# Patient Record
Sex: Male | Born: 1975 | Race: Black or African American | Hispanic: No | Marital: Single | Smoking: Current every day smoker
Health system: Southern US, Community
[De-identification: ages and names within clinical notes are randomized; demographics above are authoritative.]

## PROBLEM LIST (undated history)

## (undated) ENCOUNTER — Emergency Department: Payer: Self-pay

## (undated) DIAGNOSIS — I10 Essential (primary) hypertension: Secondary | ICD-10-CM

---

## 2010-04-25 ENCOUNTER — Emergency Department (HOSPITAL_BASED_OUTPATIENT_CLINIC_OR_DEPARTMENT_OTHER)
Admission: EM | Admit: 2010-04-25 | Discharge: 2010-04-25 | Payer: Self-pay | Source: Home / Self Care | Admitting: Emergency Medicine

## 2012-01-11 IMAGING — CR DG KNEE COMPLETE 4+V*R*
4 series · 4 of 4 positions shown · non-contrast
Comparison: None.

CLINICAL DATA: Tripped and fell.  Twisted knee.

RIGHT KNEE - COMPLETE 4+ VIEW

[t knee ap right]
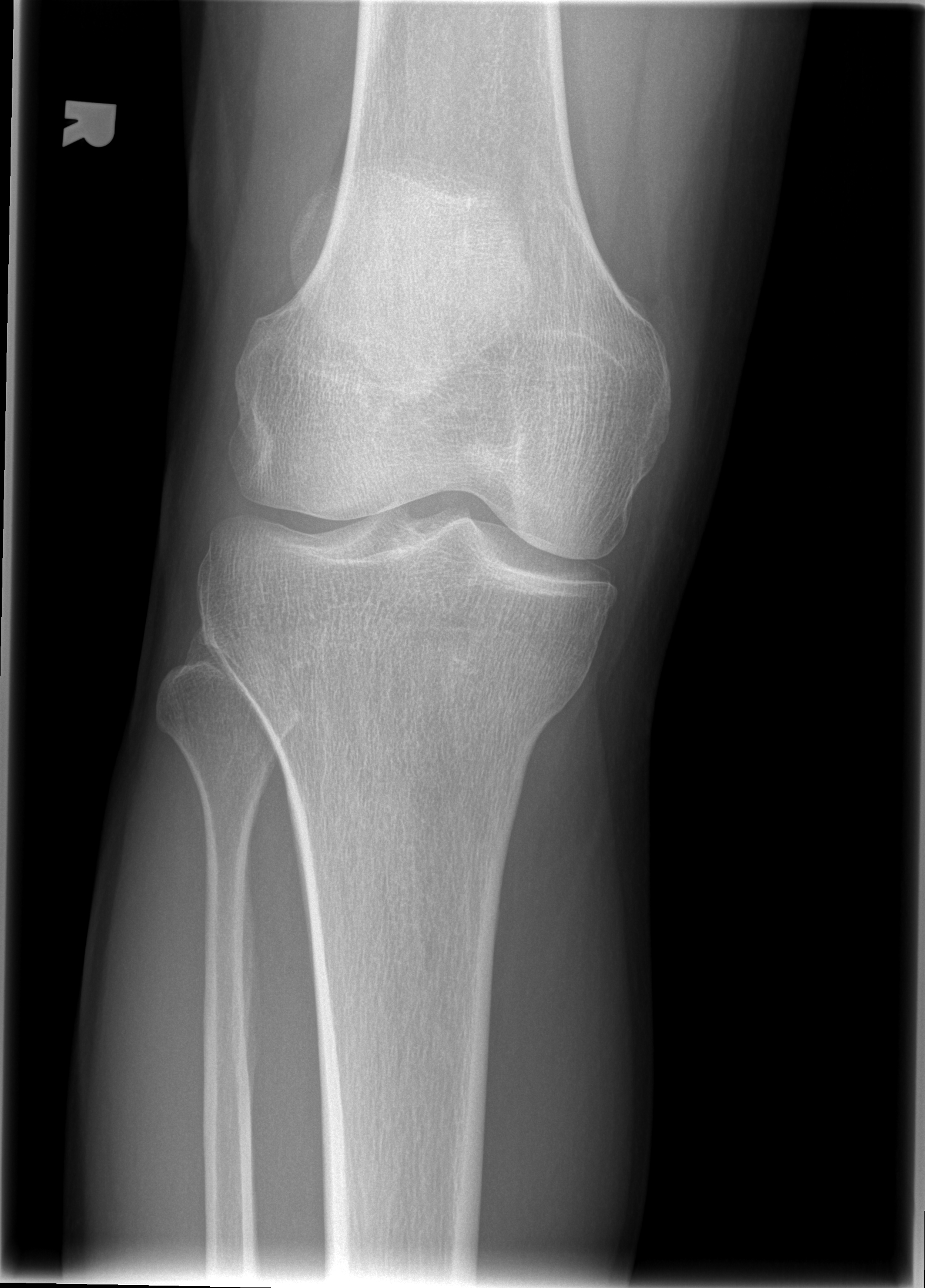

[t knee oblique right (1 of 2)]
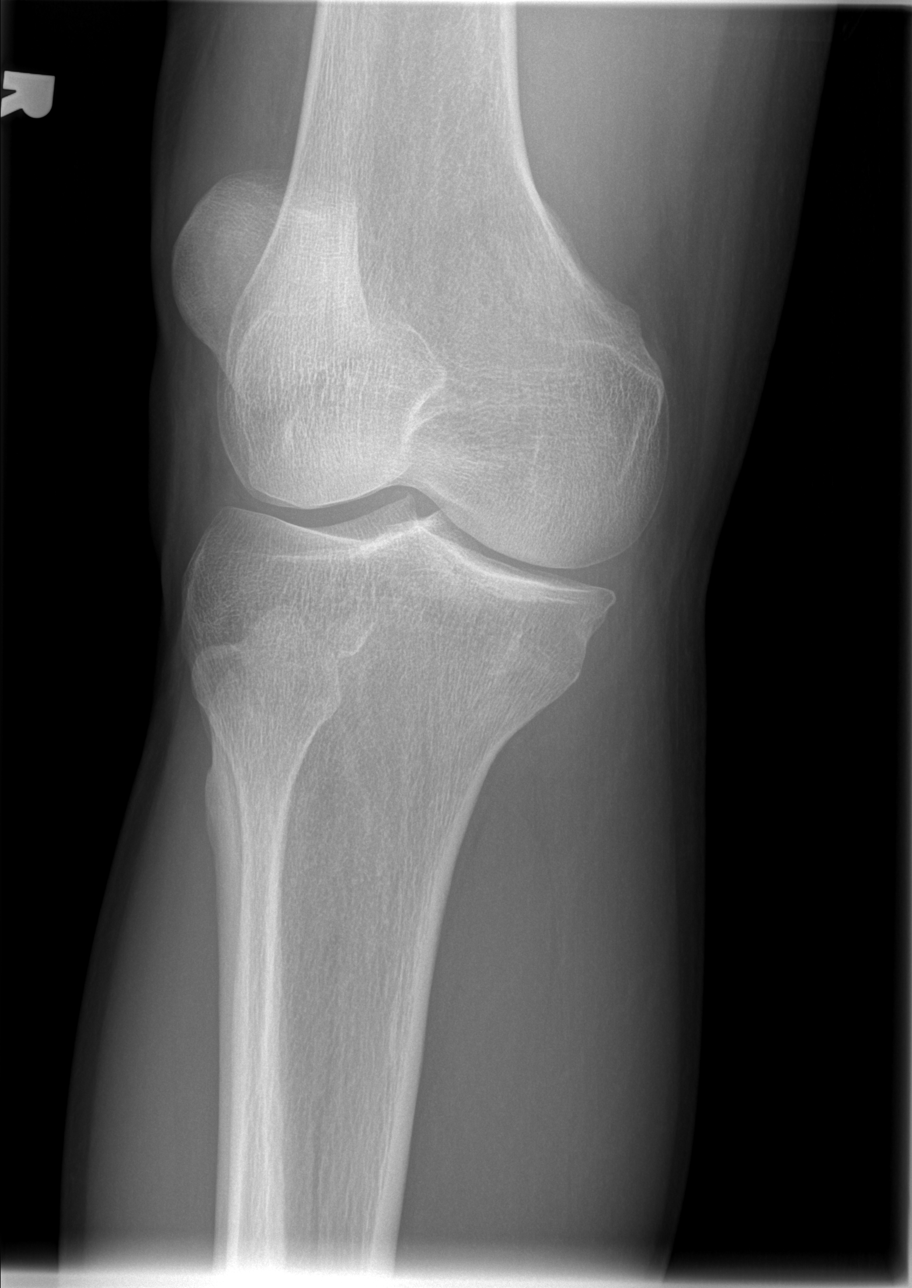

[t knee oblique right (2 of 2)]
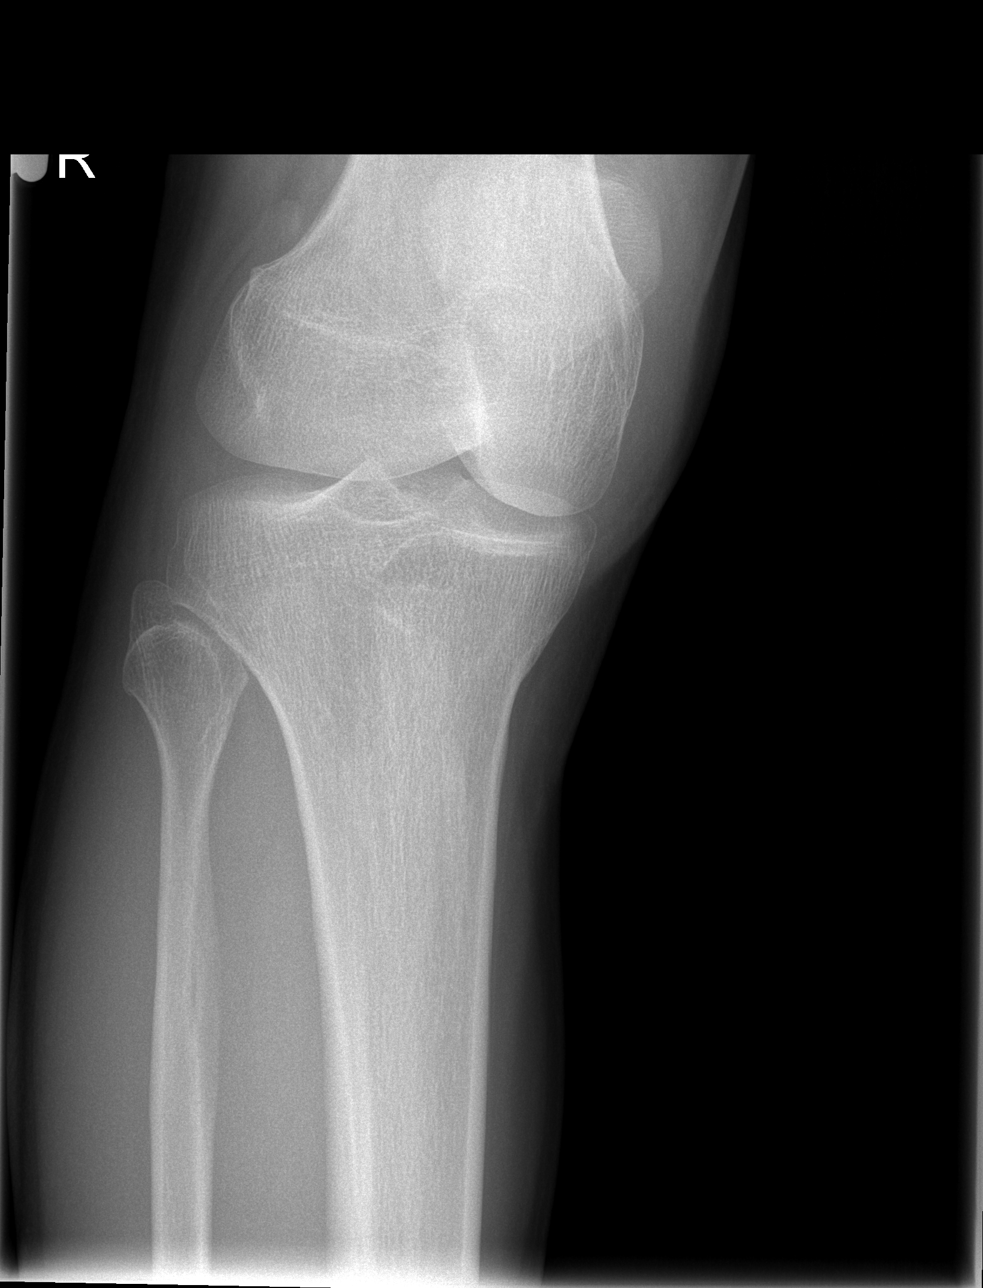

[t knee lat right]
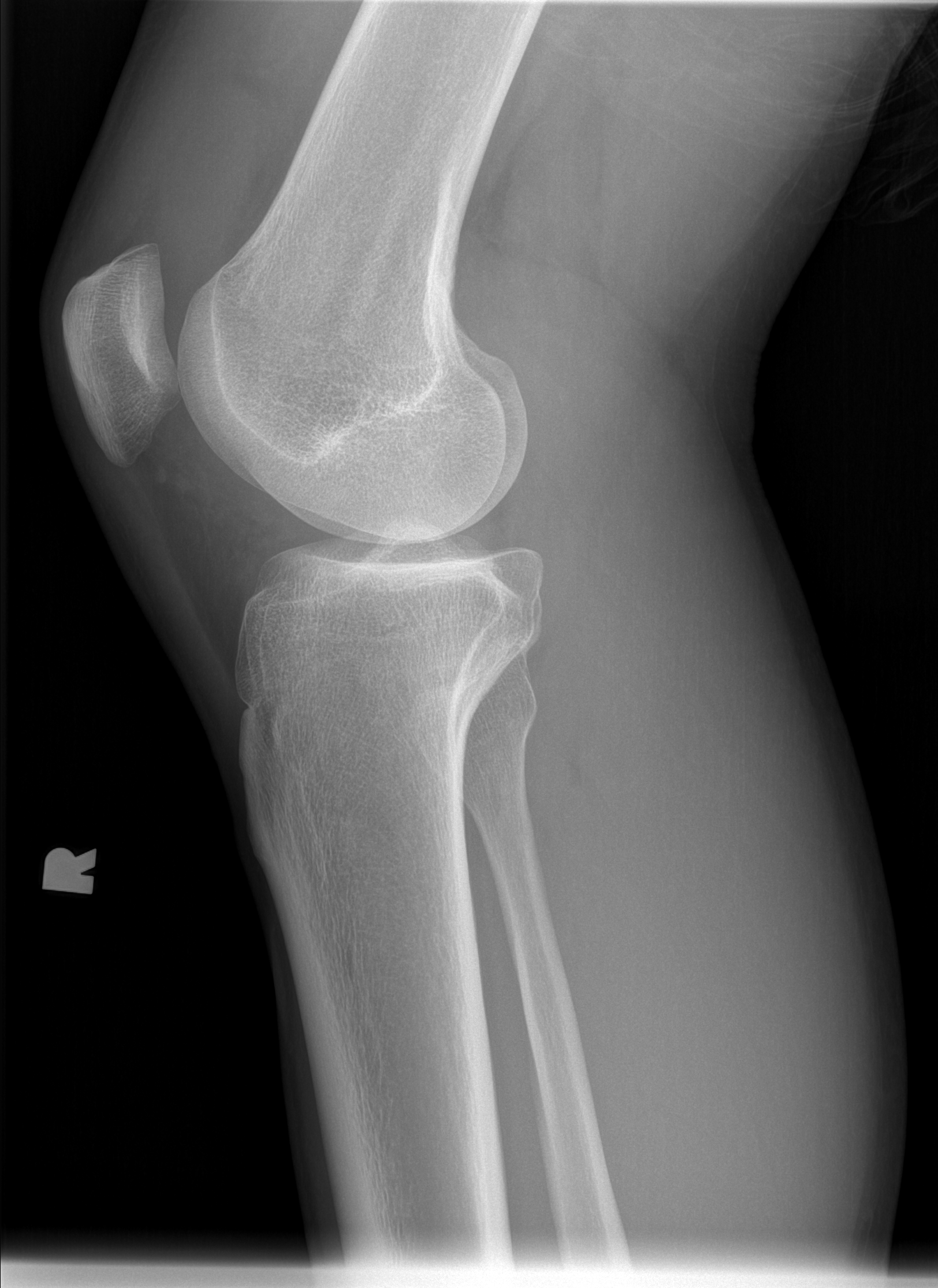

[4 of 4 positions shown; findings below may reference images not displayed]

FINDINGS: Moderate suprapatellar bursa effusion is present.  There
is no fracture or dislocation.  No radiopaque foreign body is seen.
IMPRESSION: Positive for effusion.

## 2016-05-24 ENCOUNTER — Encounter (HOSPITAL_COMMUNITY): Payer: Self-pay | Admitting: Emergency Medicine

## 2016-05-24 ENCOUNTER — Ambulatory Visit (HOSPITAL_COMMUNITY)
Admission: EM | Admit: 2016-05-24 | Discharge: 2016-05-24 | Disposition: A | Discharge status: Home / Self Care | Payer: Self-pay | Attending: Family Medicine | Admitting: Family Medicine

## 2016-05-24 DIAGNOSIS — J069 Acute upper respiratory infection, unspecified: Secondary | ICD-10-CM

## 2016-05-24 DIAGNOSIS — R040 Epistaxis: Secondary | ICD-10-CM

## 2016-05-24 DIAGNOSIS — I10 Essential (primary) hypertension: Secondary | ICD-10-CM

## 2016-05-24 HISTORY — DX: Essential (primary) hypertension: I10

## 2016-05-24 MED ORDER — TRIAMTERENE-HCTZ 37.5-25 MG PO TABS: 1.0000 | ORAL_TABLET | Freq: Every day | ORAL | 5 refills | Status: AC

## 2016-05-24 MED ORDER — AMLODIPINE BESYLATE 5 MG PO TABS
5.0000 mg | ORAL_TABLET | Freq: Every day | ORAL | 5 refills | Status: AC
Start: 2016-05-24 — End: ?

## 2016-05-24 MED ORDER — AMOXICILLIN 875 MG PO TABS: 875.0000 mg | ORAL_TABLET | Freq: Two times a day (BID) | ORAL | 0 refills | Status: AC

## 2016-05-24 MED ORDER — HYDROCODONE-HOMATROPINE 5-1.5 MG/5ML PO SYRP: 5.0000 mL | ORAL_SOLUTION | Freq: Four times a day (QID) | ORAL | 0 refills | Status: AC | PRN

## 2016-05-24 NOTE — ED Triage Notes (Signed)
One nosebleed yesterday, one nosebleed today.  Patient has no energy.  Patient thinks blood pressure is elevated.   Was taking theraflu for fever, hot and cold, cough.

## 2016-05-24 NOTE — Discharge Instructions (Signed)
You need to use Afrin nasal spray (oxymetazoline) twice a day to the left nostril starting tonight.

## 2016-05-24 NOTE — ED Provider Notes (Signed)
[facility]    CSN: [id number] Arrival date & time: 05/24/16  1815     History   Chief Complaint Chief Complaint  Patient presents with  . URI    HPI Mike Espinoza is a 41 y.o. male.   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 He's had sinus congestion and cough for about 4 days. Starting yesterday, he's had intermittent nosebleeds. He's had this problem before when his blood pressure was up.  Patient has not been able to afford doctors visit or medicine and has not been taking any blood pressure medicine for quite some time.  Patient's cough is quite severe at times. His nose is not bleeding at the present time  Patient is a smoker. He's had no hemoptysis.      Past Medical History:  Diagnosis Date  . Hypertension     There are no active problems to display for this patient.   History reviewed. No pertinent surgical history.     Home Medications    Prior to Admission medications   Medication Sig Start Date End Date Taking? Authorizing Provider  amLODipine (NORVASC) 5 MG tablet Take 1 tablet (5 mg total) by mouth daily. 05/24/16   Robyn Haber, MD  amoxicillin (AMOXIL) 875 MG tablet Take 1 tablet (875 mg total) by mouth 2 (two) times daily. 05/24/16   Robyn Haber, MD  HYDROcodone-homatropine [facility]) 5-1.5 MG/5ML syrup Take 5 mLs by mouth every 6 (six) hours as needed for cough. 05/24/16   Robyn Haber, MD  triamterene-hydrochlorothiazide (MAXZIDE-25) 37.5-25 MG tablet Take 1 tablet by mouth daily. 05/24/16   Robyn Haber, MD    Family History No family history on file.  Social History Social History  Substance Use Topics  . Smoking status: Current Every Day Smoker  . Smokeless tobacco: Not on file  . Alcohol use Yes     Allergies   Patient has no known allergies.   Review of Systems Review of Systems  Constitutional: Positive for fever.  HENT: Positive for congestion, nosebleeds and sinus pressure.     Respiratory: Positive for cough. Negative for shortness of breath.   Cardiovascular: Negative.   Neurological: Negative.      Physical Exam Triage Vital Signs ED Triage Vitals  Enc Vitals Group     BP 05/24/16 1958 (!) 165/103     Pulse Rate 05/24/16 1958 101     Resp 05/24/16 1958 24     Temp 05/24/16 1958 99.1 F (37.3 C)     Temp Source 05/24/16 1958 Oral     SpO2 05/24/16 1958 99 %     Weight --      Height --      Head Circumference --      Peak Flow --      Pain Score 05/24/16 1957 6     Pain Loc --      Pain Edu? --      Excl. in [place]? --    No data found.   Updated Vital Signs BP (!) 165/103 (BP Location: Left Arm)   Pulse 101   Temp 99.1 F (37.3 C) (Oral)   Resp 24   SpO2 99%   Physical Exam  Constitutional: He is oriented to person, place, and time. He appears well-developed and well-nourished.  HENT:  Right Ear: External ear normal.  Left Ear: External ear normal.  Mouth/Throat: Oropharynx is clear and moist.  There is no active bleeding or signs of past bleeding in  either nasal passage.  Eyes: Conjunctivae and EOM are normal. Pupils are equal, round, and reactive to light.  Neck: Normal range of motion. Neck supple.  Cardiovascular: Normal rate, regular rhythm and normal heart sounds.   Pulmonary/Chest: Effort normal.  Musculoskeletal: Normal range of motion.  Neurological: He is alert and oriented to person, place, and time.  Skin: Skin is warm and dry.  Nursing note and vitals reviewed.    UC Treatments / Results  Labs (all labs ordered are listed, but only abnormal results are displayed) Labs Reviewed - No data to display  EKG  EKG Interpretation None       Radiology No results found.  Procedures Procedures (including critical care time)  Medications Ordered in UC Medications - No data to display   Initial Impression / Assessment and Plan / UC Course  I have reviewed the triage vital signs and the nursing  notes.  Pertinent labs & imaging results that were available during my care of the patient were reviewed by me and considered in my medical decision making (see chart for details).     Final Clinical Impressions(s) / UC Diagnoses   Final diagnoses:  Upper respiratory tract infection, unspecified type  Epistaxis  Hypertension, essential    New Prescriptions New Prescriptions   AMLODIPINE (NORVASC) 5 MG TABLET    Take 1 tablet (5 mg total) by mouth daily.   AMOXICILLIN (AMOXIL) 875 MG TABLET    Take 1 tablet (875 mg total) by mouth 2 (two) times daily.   HYDROCODONE-HOMATROPINE (HYCODAN) 5-1.5 MG/5ML SYRUP    Take 5 mLs by mouth every 6 (six) hours as needed for cough.   TRIAMTERENE-HYDROCHLOROTHIAZIDE (MAXZIDE-25) 37.5-25 MG TABLET    Take 1 tablet by mouth daily.     Robyn Haber, MD 05/24/16 2012
